# Patient Record
Sex: Male | Born: 1985 | Race: White | Hispanic: No | Marital: Single | Smoking: Former smoker
Health system: Southern US, Community
[De-identification: ages and names within clinical notes are randomized; demographics above are authoritative.]

## PROBLEM LIST (undated history)

## (undated) HISTORY — PX: WISDOM TOOTH EXTRACTION: SHX21

---
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
## 2008-05-12 ENCOUNTER — Emergency Department (HOSPITAL_COMMUNITY): Admission: EM | Admit: 2008-05-12 | Discharge: 2008-05-12 | Payer: Self-pay | Admitting: Emergency Medicine

## 2012-06-23 ENCOUNTER — Encounter (HOSPITAL_COMMUNITY): Payer: Self-pay | Admitting: Emergency Medicine

## 2012-06-23 ENCOUNTER — Emergency Department (HOSPITAL_COMMUNITY): Payer: Self-pay

## 2012-06-23 ENCOUNTER — Emergency Department (HOSPITAL_COMMUNITY)
Admission: EM | Admit: 2012-06-23 | Discharge: 2012-06-23 | Disposition: A | Discharge status: Home / Self Care | Payer: Self-pay | Attending: Emergency Medicine | Admitting: Emergency Medicine

## 2012-06-23 DIAGNOSIS — M25512 Pain in left shoulder: Secondary | ICD-10-CM

## 2012-06-23 DIAGNOSIS — X500XXA Overexertion from strenuous movement or load, initial encounter: Secondary | ICD-10-CM | POA: Insufficient documentation

## 2012-06-23 DIAGNOSIS — Y9389 Activity, other specified: Secondary | ICD-10-CM | POA: Insufficient documentation

## 2012-06-23 DIAGNOSIS — S4980XA Other specified injuries of shoulder and upper arm, unspecified arm, initial encounter: Secondary | ICD-10-CM | POA: Insufficient documentation

## 2012-06-23 DIAGNOSIS — Y929 Unspecified place or not applicable: Secondary | ICD-10-CM | POA: Insufficient documentation

## 2012-06-23 DIAGNOSIS — Z87891 Personal history of nicotine dependence: Secondary | ICD-10-CM | POA: Insufficient documentation

## 2012-06-23 DIAGNOSIS — S46909A Unspecified injury of unspecified muscle, fascia and tendon at shoulder and upper arm level, unspecified arm, initial encounter: Secondary | ICD-10-CM | POA: Insufficient documentation

## 2012-06-23 MED ORDER — NAPROXEN 500 MG PO TABS: 500.0000 mg | ORAL_TABLET | Freq: Two times a day (BID) | ORAL | Status: DC

## 2012-06-23 NOTE — Progress Notes (Signed)
[facility] did see patient and provided him with a list of primary care resources.

## 2012-06-23 NOTE — ED Provider Notes (Signed)
History     CSN: [id number]  Arrival date & time 06/23/12  1044   First MD Initiated Contact with Patient 06/23/12 1204      Chief Complaint  Patient presents with  . Shoulder Pain    (Consider location/radiation/quality/duration/timing/severity/associated sxs/prior treatment) HPI Comments: Patient reports injury to left shoulder two years ago while throwing tire up to the second floor.  Reports last week he was dumping out a bucket and develop sharp pain in his anterior shoulder in the same place he had it two years ago.  Denies neck pain, weakness or numbness of his arm. Denies other injury.   Is using heat with great improvement.    Patient is a 27 y.o. male presenting with shoulder pain. The history is provided by the patient.  Shoulder Pain Pertinent negatives include no neck pain, numbness or weakness.    History reviewed. No pertinent past medical history.  History reviewed. No pertinent past surgical history.  No family history on file.  History  Substance Use Topics  . Smoking status: Former Smoker    Quit date: 04/23/2012  . Smokeless tobacco: Not on file  . Alcohol Use: No      Review of Systems  HENT: Negative for neck pain.   Musculoskeletal: Negative for back pain.  Skin: Negative for color change and wound.  Neurological: Negative for weakness and numbness.    Allergies  Review of patient's allergies indicates no known allergies.  Home Medications   Current Outpatient Rx  Name  Route  Sig  Dispense  Refill  . Menthol-Methyl Salicylate (MUSCLE RUB) 10-15 % CREA   Topical   Apply 1 application topically as needed (shoulder pain).         . naproxen (NAPROSYN) 500 MG tablet   Oral   Take 1 tablet (500 mg total) by mouth 2 (two) times daily.   20 tablet   0     There were no vitals taken for this visit.  Physical Exam  Nursing note and vitals reviewed. Constitutional: He appears well-developed and well-nourished. No distress.  HENT:    Head: Normocephalic and atraumatic.  Neck: Neck supple.  Pulmonary/Chest: Effort normal.  Musculoskeletal:       Left shoulder: He exhibits tenderness and pain. He exhibits normal range of motion, no bony tenderness, no swelling, no effusion, no crepitus, no deformity, no laceration, no spasm, normal pulse and normal strength.       Arms: Neurological: He is alert.  Skin: He is not diaphoretic.    ED Course  Procedures (including critical care time)  Labs Reviewed - No data to display Dg Shoulder Left  06/23/2012   *RADIOLOGY REPORT*  Clinical Data: Left shoulder pain  LEFT SHOULDER - 2+ VIEW  Comparison: None.  Findings: No acute fracture or dislocation is identified.  No soft tissue abnormality is seen.  The underlying bony thorax is within normal limits.  IMPRESSION: No acute abnormality seen.   Original Report Authenticated By: Alcide Clever, M.D.    1. Left shoulder pain     MDM  Pt with chronic left shoulder pain with exacerbation last week, improving with time.  Anterior tenderness.  Pain resulted from external rotation of shoulder.  Xray is negative.  No bony tenderness.  Pt has full AROM.  Neurovascularly intact.  ACE wrap, RICE treatment, and ibuprofen for pain.  Discussed all results with patient.  Pt given return precautions.  Pt verbalizes understanding and agrees with plan.  Trixie Dredge, PA-C 06/23/12 1442

## 2012-06-23 NOTE — ED Notes (Signed)
Pt states that he moved a bucket last week and experienced some shoulder pain in lt shoulder.  States that it still hurts.

## 2012-06-23 NOTE — ED Notes (Signed)
Pt states pulled shoulder last week and has pain that has not resolved, pt states this has happened before

## 2012-06-24 NOTE — ED Provider Notes (Signed)
Medical screening examination/treatment/procedure(s) were performed by non-physician practitioner and as supervising physician I was immediately available for consultation/collaboration.  Christopher J. Pollina, MD 06/24/12 0711 

## 2014-12-29 ENCOUNTER — Encounter (HOSPITAL_COMMUNITY): Payer: Self-pay | Admitting: *Deleted

## 2014-12-29 ENCOUNTER — Emergency Department (HOSPITAL_COMMUNITY)
Admission: EM | Admit: 2014-12-29 | Discharge: 2014-12-30 | Disposition: A | Discharge status: Home / Self Care | Payer: Self-pay | Attending: Emergency Medicine | Admitting: Emergency Medicine

## 2014-12-29 DIAGNOSIS — X58XXXA Exposure to other specified factors, initial encounter: Secondary | ICD-10-CM | POA: Insufficient documentation

## 2014-12-29 DIAGNOSIS — Y990 Civilian activity done for income or pay: Secondary | ICD-10-CM | POA: Insufficient documentation

## 2014-12-29 DIAGNOSIS — Y9289 Other specified places as the place of occurrence of the external cause: Secondary | ICD-10-CM | POA: Insufficient documentation

## 2014-12-29 DIAGNOSIS — Y9389 Activity, other specified: Secondary | ICD-10-CM | POA: Insufficient documentation

## 2014-12-29 DIAGNOSIS — Z87891 Personal history of nicotine dependence: Secondary | ICD-10-CM | POA: Insufficient documentation

## 2014-12-29 DIAGNOSIS — S63502A Unspecified sprain of left wrist, initial encounter: Secondary | ICD-10-CM | POA: Insufficient documentation

## 2014-12-29 NOTE — ED Notes (Signed)
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

## 2014-12-29 NOTE — ED Provider Notes (Signed)
CSN: [id number]     Arrival date & time 12/29/14  2236 History  By signing my name below, I, [facility], attest that this documentation has been prepared under the direction and in the presence of Junius Creamer, NP. Electronically Signed: Virgel Bouquet, ED Scribe. 12/29/2014. 12:03 AM.    Chief Complaint  Patient presents with  . Hand Pain   The history is provided by the patient. No language interpreter was used.   HPI Comments: Herbert Walls is a 29 y.o. male who presents to the Emergency Department complaining of left hand pain that began 1 week ago. Patient reports that the pain began as cramping in the thumb that then progressed to sharp, shooting pain in the wrist, tonight. He states that the pain is worse with use. 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 Patient denies recent injuries.   History reviewed. No pertinent past medical history. History reviewed. No pertinent past surgical history. No family history on file. Social History  Substance Use Topics  . Smoking status: Former Smoker    Quit date: 04/23/2012  . Smokeless tobacco: None  . Alcohol Use: No    Review of Systems  Musculoskeletal: Positive for arthralgias. Negative for joint swelling.  Neurological: Negative for numbness.      Allergies  Review of patient's allergies indicates no known allergies.  Home Medications   Prior to Admission medications   Medication Sig Start Date End Date Taking? Authorizing Provider  Menthol-Methyl Salicylate (MUSCLE RUB) 10-15 % CREA Apply 1 application topically as needed (shoulder pain).    Historical Provider, MD  naproxen (NAPROSYN) 500 MG tablet Take 1 tablet (500 mg total) by mouth 2 (two) times daily. 12/30/14   Junius Creamer, NP   BP 122/67 mmHg  Pulse 98  Temp(Src) 97.9 F (36.6 C) (Oral)  Resp 16  Ht 5\' 6"  (1.676 m)  Wt 68.04 kg  BMI 24.22 kg/m2  SpO2 99% Physical Exam  Constitutional: He  appears well-developed and well-nourished.  Neck: Normal range of motion.  Cardiovascular: Normal rate.   Pulmonary/Chest: Effort normal.  Musculoskeletal: He exhibits tenderness. He exhibits no edema.       Left wrist: He exhibits decreased range of motion and tenderness. He exhibits no crepitus, no deformity and no laceration.       Arms: Neurological: He is alert.  Skin: Skin is warm and dry. No erythema.  Nursing note and vitals reviewed.   ED Course  Procedures   DIAGNOSTIC STUDIES: Oxygen Saturation is 100% on RA, normal by my interpretation.    COORDINATION OF CARE: 11:54 PM Will order left hand x-ray. Discussed treatment plan with pt at bedside and pt agreed to plan.   Labs Review Labs Reviewed - No data to display  Imaging Review Dg Wrist Complete Left  12/30/2014  CLINICAL DATA:  29 year old male with pain in the left wrist. EXAM: LEFT WRIST - COMPLETE 3+ VIEW COMPARISON:  None. FINDINGS: There is no evidence of fracture or dislocation. There is no evidence of arthropathy or other focal bone abnormality. Soft tissues are unremarkable. IMPRESSION: Negative. Electronically Signed   By: Anner Crete M.D.   On: 12/30/2014 00:28   I have personally reviewed and evaluated these images and lab results as part of my medical decision-making.   EKG Interpretation None     Patient's x-ray has been refused.  There is no abnormal pathology to explain patient's symptoms.  He has been  placed in a splint, which he states has relieved a lot of his discomfort he's been instructed to wear this for the next 7-10 days to take Naprosyn on a regular basis as well as no improvement.  He is to see Dr. Apolonio Schneiders.  The hand surgery for further evaluation MDM   Final diagnoses:  Wrist sprain, left, initial encounter       I personally performed the services described in this documentation, which was scribed in my presence. The recorded information has been reviewed and is  accurate.   Junius Creamer, NP 12/30/14 0122  Junius Creamer, NP [phone]  Delora Fuel, MD [id number] [phone]

## 2014-12-29 NOTE — ED Notes (Signed)
Pt denies trauma/injury

## 2014-12-30 ENCOUNTER — Emergency Department (HOSPITAL_COMMUNITY): Payer: Self-pay

## 2014-12-30 MED ORDER — NAPROXEN 500 MG PO TABS: 500.0000 mg | ORAL_TABLET | Freq: Two times a day (BID) | ORAL | Status: DC

## 2014-12-30 MED ORDER — ONDANSETRON 4 MG PO TBDP
4.0000 mg | ORAL_TABLET | Freq: Once | ORAL | Status: DC
Start: 2014-12-30 — End: 2014-12-30

## 2014-12-30 NOTE — ED Notes (Signed)
Patient transported to X-ray 

## 2014-12-30 NOTE — Discharge Instructions (Signed)
Your x-ray does not reveal any subluxation or deformity of any of the bones U been placed in a splint for comfort.  Please wear this at all times while you were up and about.  He may take it off to sleep and Nubain.  I would wear this on a regular basis for the next 7-10 days.  Please also take the anti-inflammatory medication on a regular basis for at least the next week.  See improvement during this.  Of time U been given a referral to Dr. Apolonio Schneiders who is a hand specialist.  Please call and make an appointment with him

## 2017-07-17 ENCOUNTER — Emergency Department (HOSPITAL_COMMUNITY): Payer: Self-pay

## 2017-07-17 ENCOUNTER — Encounter (HOSPITAL_COMMUNITY): Payer: Self-pay | Admitting: Emergency Medicine

## 2017-07-17 ENCOUNTER — Emergency Department (HOSPITAL_COMMUNITY)
Admission: EM | Admit: 2017-07-17 | Discharge: 2017-07-17 | Disposition: A | Discharge status: Home / Self Care | Payer: Self-pay | Attending: Emergency Medicine | Admitting: Emergency Medicine

## 2017-07-17 DIAGNOSIS — R109 Unspecified abdominal pain: Secondary | ICD-10-CM | POA: Insufficient documentation

## 2017-07-17 DIAGNOSIS — F17210 Nicotine dependence, cigarettes, uncomplicated: Secondary | ICD-10-CM | POA: Insufficient documentation

## 2017-07-17 DIAGNOSIS — K921 Melena: Secondary | ICD-10-CM | POA: Insufficient documentation

## 2017-07-17 LAB — CBC
HEMATOCRIT: 40.5 % (ref 39.0–52.0)
HEMOGLOBIN: 13.2 g/dL (ref 13.0–17.0)
MCH: 27 pg (ref 26.0–34.0)
MCHC: 32.6 g/dL (ref 30.0–36.0)
MCV: 83 fL (ref 78.0–100.0)
Platelets: 398 10*3/uL (ref 150–400)
RBC: 4.88 MIL/uL (ref 4.22–5.81)
RDW: 14.1 % (ref 11.5–15.5)
WBC: 7.9 10*3/uL (ref 4.0–10.5)

## 2017-07-17 LAB — COMPREHENSIVE METABOLIC PANEL
ALBUMIN: 4.5 g/dL (ref 3.5–5.0)
ALK PHOS: 56 U/L (ref 38–126)
ALT: 28 U/L (ref 0–44)
AST: 20 U/L (ref 15–41)
Anion gap: 7 (ref 5–15)
BUN: 16 mg/dL (ref 6–20)
CO2: 29 mmol/L (ref 22–32)
Calcium: 9.4 mg/dL (ref 8.9–10.3)
Chloride: 106 mmol/L (ref 98–111)
Creatinine, Ser: 0.92 mg/dL (ref 0.61–1.24)
GFR calc Af Amer: >60 mL/min (ref >60)
GFR calc non Af Amer: >60 mL/min (ref >60)
GLUCOSE: 102 mg/dL — AB (ref 70–99)
POTASSIUM: 4 mmol/L (ref 3.5–5.1)
SODIUM: 142 mmol/L (ref 135–145)
Total Bilirubin: 0.6 mg/dL (ref 0.3–1.2)
Total Protein: 8.3 g/dL — ABNORMAL HIGH (ref 6.5–8.1)

## 2017-07-17 LAB — DIFFERENTIAL
Basophils Absolute: 0.1 10*3/uL (ref 0.0–0.1)
Basophils Relative: 1 %
EOS PCT: 1 %
Eosinophils Absolute: 0.1 10*3/uL (ref 0.0–0.7)
LYMPHS ABS: 2.5 10*3/uL (ref 0.7–4.0)
LYMPHS PCT: 32 %
MONO ABS: 0.6 10*3/uL (ref 0.1–1.0)
Monocytes Relative: 7 %
Neutro Abs: 4.7 10*3/uL (ref 1.7–7.7)
Neutrophils Relative %: 59 %

## 2017-07-17 LAB — I-STAT CHEM 8, ED
BUN: 15 mg/dL (ref 6–20)
CHLORIDE: 103 mmol/L (ref 98–111)
CREATININE: 0.9 mg/dL (ref 0.61–1.24)
Calcium, Ion: 1.24 mmol/L (ref 1.15–1.40)
GLUCOSE: 96 mg/dL (ref 70–99)
HCT: 41 % (ref 39.0–52.0)
Hemoglobin: 13.9 g/dL (ref 13.0–17.0)
POTASSIUM: 3.8 mmol/L (ref 3.5–5.1)
Sodium: 142 mmol/L (ref 135–145)
TCO2: 26 mmol/L (ref 22–32)

## 2017-07-17 LAB — TYPE AND SCREEN
ABO/RH(D): A POS
Antibody Screen: NEGATIVE

## 2017-07-17 LAB — ABO/RH: ABO/RH(D): A POS

## 2017-07-17 MED ORDER — CIPROFLOXACIN HCL 500 MG PO TABS: 500.0000 mg | ORAL_TABLET | Freq: Two times a day (BID) | ORAL | 0 refills | Status: DC

## 2017-07-17 MED ORDER — IOPAMIDOL (ISOVUE-300) INJECTION 61%
100.0000 mL | Freq: Once | INTRAVENOUS | Status: AC | PRN
  Administered 2017-07-17: 100 mL via INTRAVENOUS

## 2017-07-17 MED ORDER — SODIUM CHLORIDE 0.9 % IV BOLUS
1000.0000 mL | Freq: Once | INTRAVENOUS | Status: AC
  Administered 2017-07-17: 1000 mL via INTRAVENOUS

## 2017-07-17 MED ORDER — IOPAMIDOL (ISOVUE-300) INJECTION 61%
INTRAVENOUS | Status: AC
  Filled 2017-07-17: qty 100

## 2017-07-17 NOTE — Discharge Instructions (Addendum)
Follow-up with [facility] in 1 to 2 weeks for recheck

## 2017-07-17 NOTE — ED Triage Notes (Signed)
Pt reports that he was at work and started having abd cramping and had 5 differ trips to restroom. Denies diarrhea but states that last 2 BMs had blood in it.

## 2017-07-17 NOTE — ED Provider Notes (Signed)
[facility] DEPT Provider Note   CSN: [id number] Arrival date & time: 07/17/17  1452     History   Chief Complaint Chief Complaint  Patient presents with  . Blood In Stools    HPI Herbert Walls is a 32 y.o. male.  Patient complains of blood in stool with abdominal cramping and diarrhea.  The history is provided by the patient. No language interpreter was used.  Rectal Bleeding  Quality:  Bright red Amount:  Moderate Timing:  Constant Chronicity:  New Context: not anal fissures   Similar prior episodes: no   Relieved by:  Nothing Worsened by:  Nothing Ineffective treatments:  None tried Associated symptoms: no abdominal pain     History reviewed. No pertinent past medical history.  There are no active problems to display for this patient.   Past Surgical History:  Procedure Laterality Date  . WISDOM TOOTH EXTRACTION          Home Medications    Prior to Admission medications   Medication Sig Start Date End Date Taking? Authorizing Provider  ciprofloxacin (CIPRO) 500 MG tablet Take 1 tablet (500 mg total) by mouth 2 (two) times daily. 07/17/17   Milton Ferguson, MD  naproxen (NAPROSYN) 500 MG tablet Take 1 tablet (500 mg total) by mouth 2 (two) times daily. Patient not taking: Reported on 07/17/2017 12/30/14   Junius Creamer, NP    Family History No family history on file.  Social History Social History   Tobacco Use  . Smoking status: Current Every Day Smoker    Types: Cigarettes  . Smokeless tobacco: Never Used  Substance Use Topics  . Alcohol use: No  . Drug use: No     Allergies   Patient has no known allergies.   Review of Systems Review of Systems  Constitutional: Negative for appetite change and fatigue.  HENT: Negative for congestion, ear discharge and sinus pressure.   Eyes: Negative for discharge.  Respiratory: Negative for cough.   Cardiovascular: Negative for chest pain.  Gastrointestinal: Positive  for diarrhea and hematochezia. Negative for abdominal pain.  Genitourinary: Negative for frequency and hematuria.  Musculoskeletal: Negative for back pain.  Skin: Negative for rash.  Neurological: Negative for seizures and headaches.  Psychiatric/Behavioral: Negative for hallucinations.     Physical Exam Updated Vital Signs BP (!) 142/76 (BP Location: Left Arm)   Pulse (!) 105   Temp 98.5 F (36.9 C) (Oral)   Resp 16   Ht 5\' 6"  (1.676 m)   Wt 70.3 kg (155 lb)   SpO2 100%   BMI 25.02 kg/m   Physical Exam  Constitutional: He is oriented to person, place, and time. He appears well-developed.  HENT:  Head: Normocephalic.  Eyes: Conjunctivae and EOM are normal. No scleral icterus.  Neck: Neck supple. No thyromegaly present.  Cardiovascular: Normal rate and regular rhythm. Exam reveals no gallop and no friction rub.  No murmur heard. Pulmonary/Chest: No stridor. He has no wheezes. He has no rales. He exhibits no tenderness.  Abdominal: He exhibits no distension. There is tenderness. There is no rebound.  Musculoskeletal: Normal range of motion. He exhibits no edema.  Lymphadenopathy:    He has no cervical adenopathy.  Neurological: He is oriented to person, place, and time. He exhibits normal muscle tone. Coordination normal.  Skin: No rash noted. No erythema.  Psychiatric: He has a normal mood and affect. His behavior is normal.     ED Treatments / Results  Labs (  all labs ordered are listed, but only abnormal results are displayed) Labs Reviewed  COMPREHENSIVE METABOLIC PANEL - Abnormal; Notable for the following components:      Result Value   Glucose, Bld 102 (*)    Total Protein 8.3 (*)    All other components within normal limits  CBC  DIFFERENTIAL  I-STAT CHEM 8, ED  POC OCCULT BLOOD, ED  TYPE AND SCREEN    EKG None  Radiology Ct Abdomen Pelvis W Contrast  Result Date: 07/17/2017 CLINICAL DATA:  Abdominal cramping.  Reported blood in the stool. EXAM: CT  ABDOMEN AND PELVIS WITH CONTRAST TECHNIQUE: Multidetector CT imaging of the abdomen and pelvis was performed using the standard protocol following bolus administration of intravenous contrast. CONTRAST:  158mL ISOVUE-300 IOPAMIDOL (ISOVUE-300) INJECTION 61% COMPARISON:  None. FINDINGS: Lower chest: Clear lung bases.  Heart normal in size Hepatobiliary: No focal liver abnormality is seen. No gallstones, gallbladder wall thickening, or biliary dilatation. Pancreas: Unremarkable. No pancreatic ductal dilatation or surrounding inflammatory changes. Spleen: Normal in size without focal abnormality. Adrenals/Urinary Tract: Adrenal glands are unremarkable. Kidneys are normal, without renal calculi, focal lesion, or hydronephrosis. Bladder is unremarkable. Stomach/Bowel: Stomach is within normal limits. Appendix appears normal. No evidence of bowel wall thickening, distention, or inflammatory changes. No increase in colonic stool. There are few small left colon diverticula. Vascular/Lymphatic: No significant vascular findings are present. No enlarged abdominal or pelvic lymph nodes. Reproductive: Unremarkable. Other: No abdominal wall hernia or abnormality. No abdominopelvic ascites. Musculoskeletal: No acute or significant osseous findings. IMPRESSION: 1. No acute findings within the abdomen or pelvis. 2. There are few small left colon diverticula. No diverticulitis. No other bowel abnormality. Exam otherwise within normal limits. Electronically Signed   By: Lajean Manes M.D.   On: 07/17/2017 17:32    Procedures Procedures (including critical care time)  Medications Ordered in ED Medications  iopamidol (ISOVUE-300) 61 % injection (has no administration in time range)  sodium chloride 0.9 % bolus 1,000 mL (1,000 mLs Intravenous New Bag/Given 07/17/17 1635)  iopamidol (ISOVUE-300) 61 % injection 100 mL (100 mLs Intravenous Contrast Given 07/17/17 1708)     Initial Impression / Assessment and Plan / ED Course  I  have reviewed the triage vital signs and the nursing notes.  Pertinent labs & imaging results that were available during my care of the patient were reviewed by me and considered in my medical decision making (see chart for details).     Labs and CT unremarkable.  Suspect mild colitis.  He will be placed on Cipro and will follow-up with GI  Final Clinical Impressions(s) / ED Diagnoses   Final diagnoses:  None    ED Discharge Orders        Ordered    ciprofloxacin (CIPRO) 500 MG tablet  2 times daily     07/17/17 1746       Milton Ferguson, MD 07/17/17 1750

## 2019-04-07 IMAGING — CT CT ABD-PELV W/ CM
2 of 4 series · 17 of 46 positions shown, 19 images · IV contrast (ISOVUE)
Comparison: None.

CLINICAL DATA: Abdominal cramping.  Reported blood in the stool.

EXAM:
CT ABDOMEN AND PELVIS WITH CONTRAST
TECHNIQUE: Multidetector CT imaging of the abdomen and pelvis was performed
using the standard protocol following bolus administration of
intravenous contrast.
CONTRAST:  100mL D25US0-K33 IOPAMIDOL (D25US0-K33) INJECTION 61%

[Series 2: axial st · axial · 0.80mm/px · z∈[-458,-63]mm · 14 of 89 slices shown, 16 images]
[im 5/89  soft-tissue]
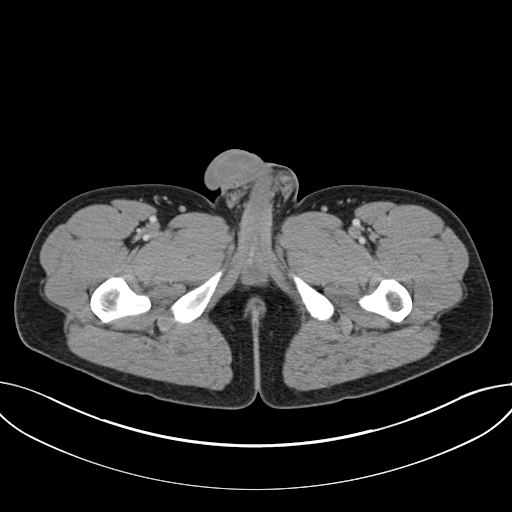
[im 5/89  bone]
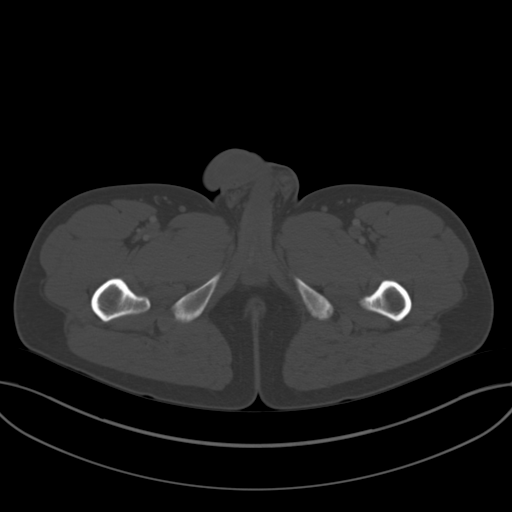
[im 10/89  soft-tissue]
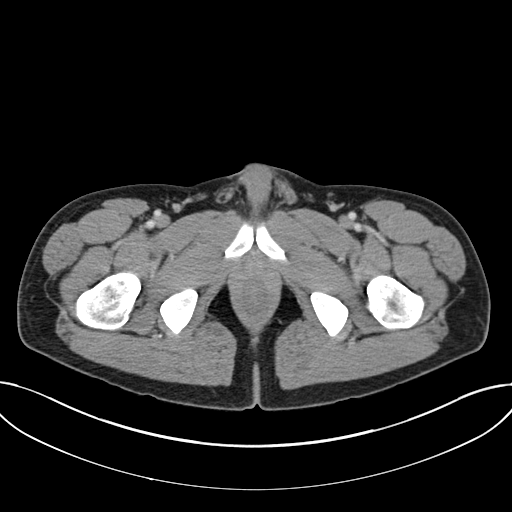
[im 20/89  soft-tissue]
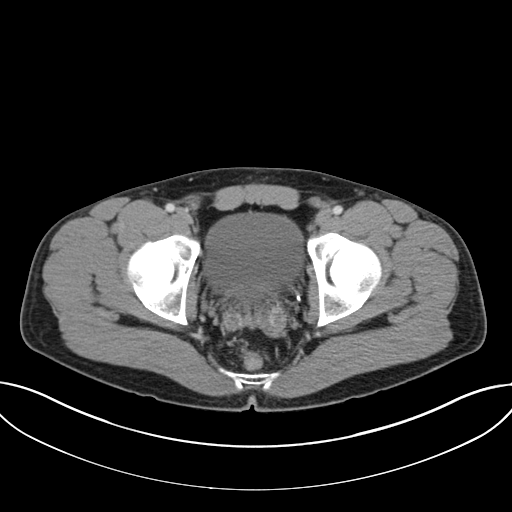
[im 25/89  soft-tissue]
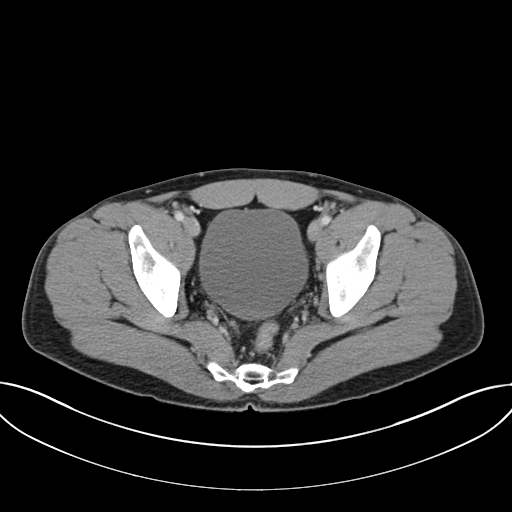
[im 30/89  soft-tissue]
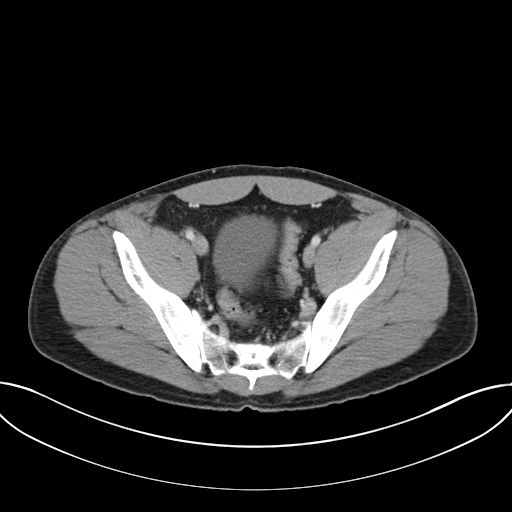
[im 35/89  soft-tissue]
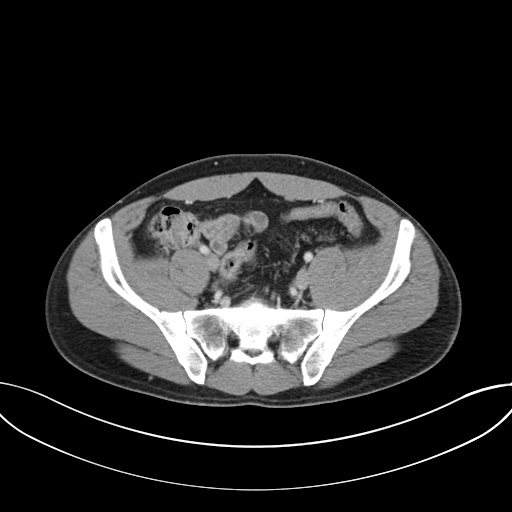
[im 40/89  soft-tissue]
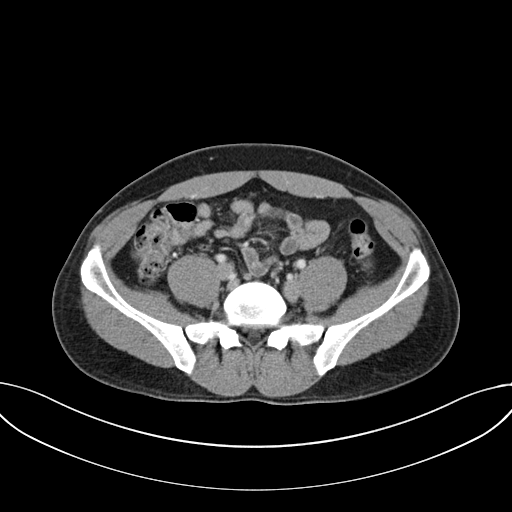
[im 49/89  soft-tissue]
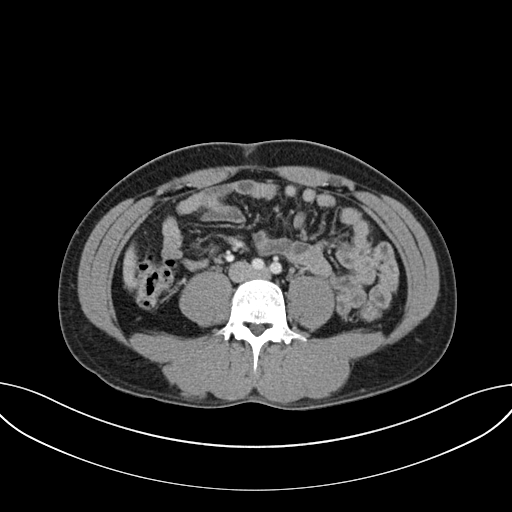
[im 54/89  soft-tissue]
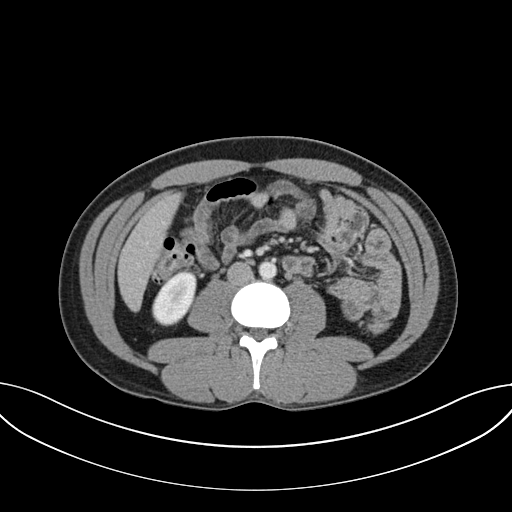
[im 54/89  bone]
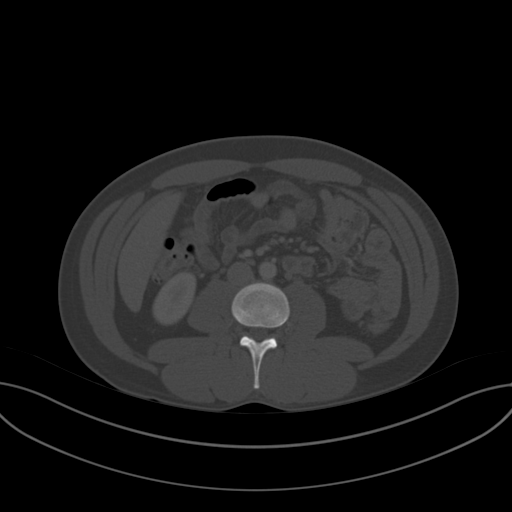
[im 59/89  soft-tissue]
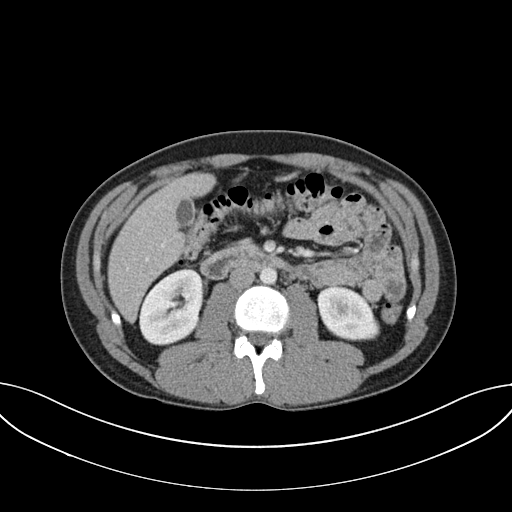
[im 64/89  soft-tissue]
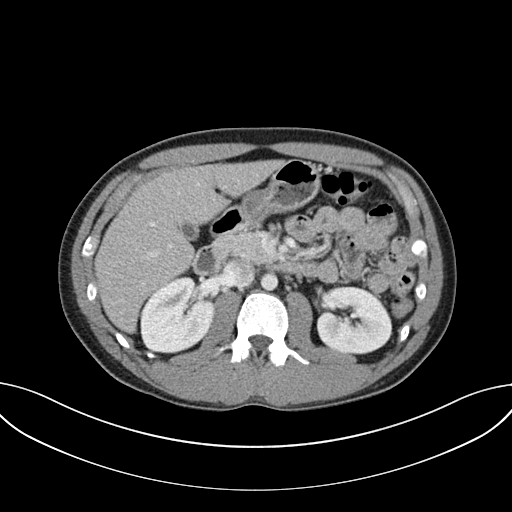
[im 69/89  soft-tissue]
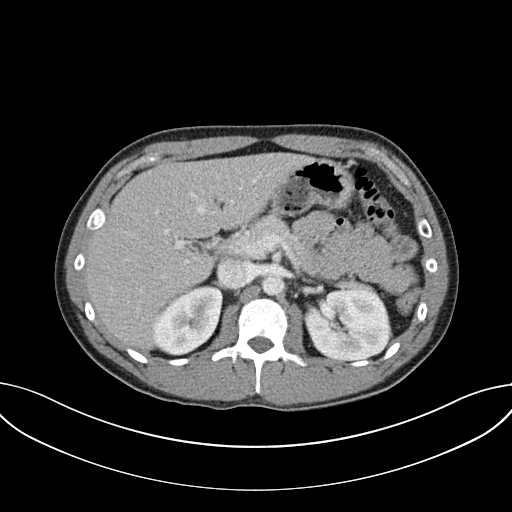
[im 79/89  soft-tissue]
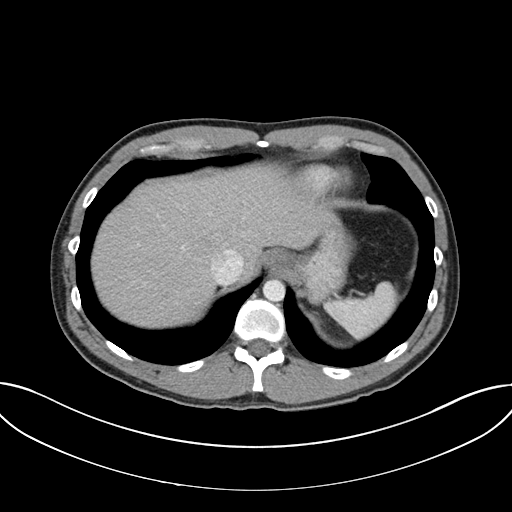
[im 84/89  soft-tissue]
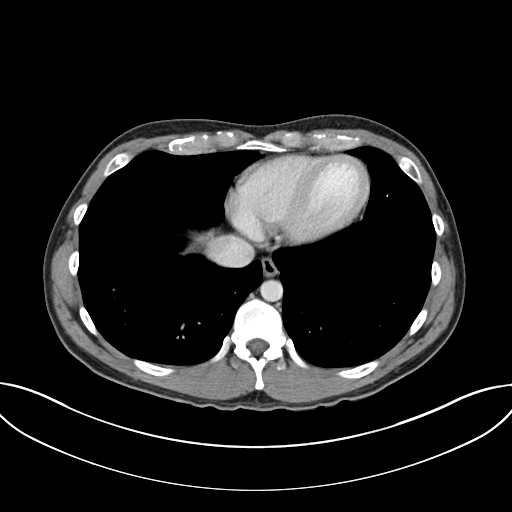

[Series 5: coronal st · coronal · 0.84mm/px · 3 of 101 slices shown]
[im 34/101  soft-tissue]
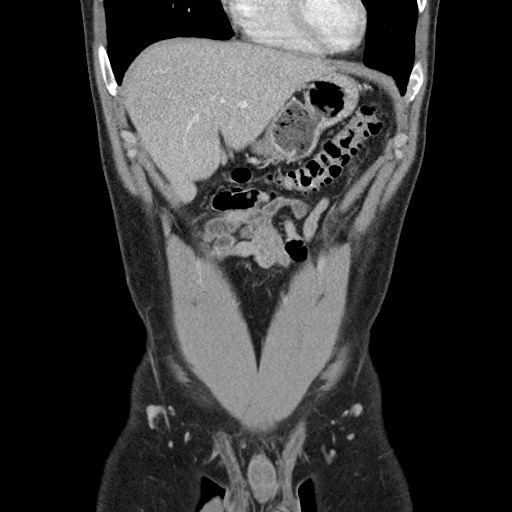
[im 45/101  soft-tissue]
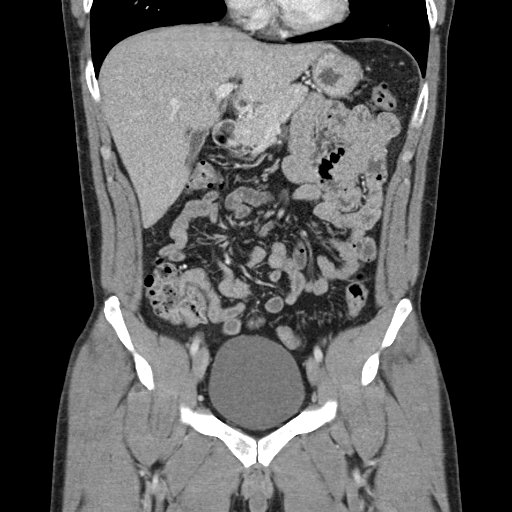
[im 56/101  soft-tissue]
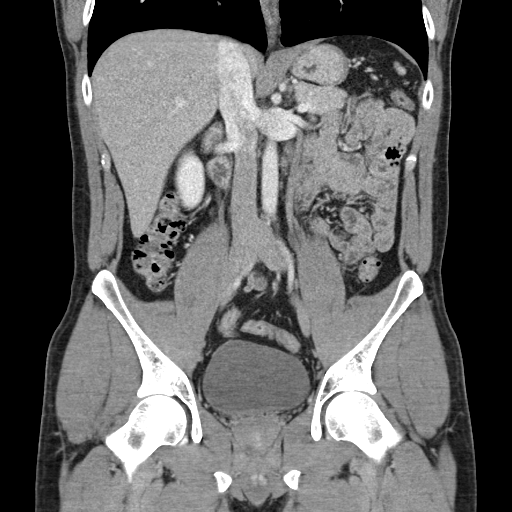

[17 of 46 positions shown; findings below may reference images not displayed]

FINDINGS: Lower chest: Clear lung bases.  Heart normal in size

Hepatobiliary: No focal liver abnormality is seen. No gallstones,
gallbladder wall thickening, or biliary dilatation.

Pancreas: Unremarkable. No pancreatic ductal dilatation or
surrounding inflammatory changes.

Spleen: Normal in size without focal abnormality.

Adrenals/Urinary Tract: Adrenal glands are unremarkable. Kidneys are
normal, without renal calculi, focal lesion, or hydronephrosis.
Bladder is unremarkable.

Stomach/Bowel: Stomach is within normal limits. Appendix appears
normal. No evidence of bowel wall thickening, distention, or
inflammatory changes. No increase in colonic stool. There are few
small left colon diverticula.

Vascular/Lymphatic: No significant vascular findings are present. No
enlarged abdominal or pelvic lymph nodes.

Reproductive: Unremarkable.

Other: No abdominal wall hernia or abnormality. No abdominopelvic
ascites.

Musculoskeletal: No acute or significant osseous findings.
IMPRESSION: 1. No acute findings within the abdomen or pelvis.
2. There are few small left colon diverticula. No diverticulitis. No
other bowel abnormality. Exam otherwise within normal limits.

## 2020-11-15 ENCOUNTER — Other Ambulatory Visit: Payer: Self-pay

## 2020-11-15 ENCOUNTER — Ambulatory Visit (HOSPITAL_COMMUNITY)
Admission: EM | Admit: 2020-11-15 | Discharge: 2020-11-15 | Disposition: A | Discharge status: Home / Self Care | Payer: Self-pay | Attending: Physician Assistant | Admitting: Physician Assistant

## 2020-11-15 ENCOUNTER — Encounter (HOSPITAL_COMMUNITY): Payer: Self-pay

## 2020-11-15 DIAGNOSIS — R509 Fever, unspecified: Secondary | ICD-10-CM | POA: Insufficient documentation

## 2020-11-15 DIAGNOSIS — J069 Acute upper respiratory infection, unspecified: Secondary | ICD-10-CM | POA: Insufficient documentation

## 2020-11-15 DIAGNOSIS — U071 COVID-19: Secondary | ICD-10-CM | POA: Insufficient documentation

## 2020-11-15 DIAGNOSIS — J029 Acute pharyngitis, unspecified: Secondary | ICD-10-CM | POA: Insufficient documentation

## 2020-11-15 LAB — POCT RAPID STREP A, ED / UC: Streptococcus, Group A Screen (Direct): NEGATIVE

## 2020-11-15 LAB — POCT INFECTIOUS MONO SCREEN, ED / UC: Mono Screen: NEGATIVE

## 2020-11-15 LAB — SARS CORONAVIRUS 2 (TAT 6-24 HRS): SARS Coronavirus 2: POSITIVE — AB

## 2020-11-15 MED ORDER — PREDNISONE 20 MG PO TABS: 40.0000 mg | ORAL_TABLET | Freq: Every day | ORAL | 0 refills | Status: AC

## 2020-11-15 NOTE — ED Provider Notes (Signed)
[place]    CSN: [id number] Arrival date & time: 11/15/20  0930      History   Chief Complaint Chief Complaint  Patient presents with   Sore Throat   Generalized Body Aches    HPI Herbert Walls is a 35 y.o. male.   Patient presents today with a 4-day history of sore throat and fever.  Reports associated fatigue, malaise, body aches.  Denies any cough, congestion, chest pain, shortness of breath, nausea, vomiting.  Does report sick contacts but is unsure if they were ultimately diagnosed with.  Denies any recent antibiotic use.  Has tried NyQuil and DayQuil with improvement but not resolution of symptoms.  He is able to eat and drink despite sore throat but does report difficulty with solid food as result of symptoms.  Pain is rated 7 on a 0-10 pain scale, localized to posterior oropharynx, described as sharp, worse with swallowing, no alleviating factors identified.  Denies any muffled voice, difficulty swallowing secretions, shortness of breath.   History reviewed. No pertinent past medical history.  There are no problems to display for this patient.   Past Surgical History:  Procedure Laterality Date   WISDOM TOOTH EXTRACTION         Home Medications    Prior to Admission medications   Medication Sig Start Date End Date Taking? Authorizing Provider  predniSONE (DELTASONE) 20 MG tablet Take 2 tablets (40 mg total) by mouth daily for 4 days. 11/15/20 11/19/20 Yes Meagan Spease, Derry Skill, PA-C    Family History Family History  Family history unknown: Yes    Social History Social History   Tobacco Use   Smoking status: Every Day    Types: Cigarettes   Smokeless tobacco: Never  Vaping Use   Vaping Use: Former  Substance Use Topics   Alcohol use: No   Drug use: No     Allergies   Patient has no known allergies.   Review of Systems Review of Systems  Constitutional:  Positive for activity change, fatigue and fever. Negative for appetite change.   HENT:  Positive for sore throat and trouble swallowing. Negative for congestion, sinus pressure and sneezing.   Respiratory:  Negative for cough and shortness of breath.   Cardiovascular:  Negative for chest pain.  Gastrointestinal:  Negative for abdominal pain, diarrhea, nausea and vomiting.  Musculoskeletal:  Positive for arthralgias and myalgias.  Neurological:  Negative for dizziness, light-headedness and headaches.    Physical Exam Triage Vital Signs ED Triage Vitals  Enc Vitals Group     BP 11/15/20 1109 125/83     Pulse Rate 11/15/20 1109 99     Resp 11/15/20 1109 18     Temp 11/15/20 1109 (!) 100.8 F (38.2 C)     Temp Source 11/15/20 1109 Oral     SpO2 11/15/20 1109 96 %     Weight --      Height --      Head Circumference --      Peak Flow --      Pain Score 11/15/20 1108 7     Pain Loc --      Pain Edu? --      Excl. in [place]? --    No data found.  Updated Vital Signs BP 125/83 (BP Location: Right Arm)   Pulse 99   Temp (!) 100.8 F (38.2 C) (Oral)   Resp 18   SpO2 96%   Visual Acuity Right Eye Distance:  Left Eye Distance:   Bilateral Distance:    Right Eye Near:   Left Eye Near:    Bilateral Near:     Physical Exam Vitals reviewed.  Constitutional:      General: He is awake.     Appearance: Normal appearance. He is well-developed. He is not ill-appearing.     Comments: Very pleasant male appears stated age in no acute distress sitting comfortably in exam room  HENT:     Head: Normocephalic and atraumatic.     Right Ear: Tympanic membrane, ear canal and external ear normal. Tympanic membrane is not erythematous or bulging.     Left Ear: Tympanic membrane, ear canal and external ear normal. Tympanic membrane is not erythematous or bulging.     Nose: Nose normal.     Mouth/Throat:     Pharynx: Uvula midline. Posterior oropharyngeal erythema present. No oropharyngeal exudate.     Tonsils: No tonsillar exudate or tonsillar abscesses. 2+ on the  right. 2+ on the left.  Cardiovascular:     Rate and Rhythm: Normal rate and regular rhythm.     Heart sounds: Normal heart sounds, S1 normal and S2 normal. No murmur heard. Pulmonary:     Effort: Pulmonary effort is normal. No accessory muscle usage or respiratory distress.     Breath sounds: Normal breath sounds. No stridor. No wheezing, rhonchi or rales.     Comments: Clear to auscultation bilaterally Abdominal:     General: Bowel sounds are normal.     Palpations: Abdomen is soft.     Tenderness: There is no abdominal tenderness.  Lymphadenopathy:     Head:     Right side of head: No submental, submandibular or tonsillar adenopathy.     Left side of head: No submental, submandibular or tonsillar adenopathy.     Cervical: No cervical adenopathy.  Neurological:     Mental Status: He is alert.  Psychiatric:        Behavior: Behavior is cooperative.     UC Treatments / Results  Labs (all labs ordered are listed, but only abnormal results are displayed) Labs Reviewed  CULTURE, GROUP A STREP ([place])  SARS CORONAVIRUS 2 (TAT 6-24 HRS)  POCT RAPID STREP A, ED / UC  POCT INFECTIOUS MONO SCREEN, ED / UC    EKG   Radiology No results found.  Procedures Procedures (including critical care time)  Medications Ordered in UC Medications - No data to display  Initial Impression / Assessment and Plan / UC Course  I have reviewed the triage vital signs and the nursing notes.  Pertinent labs & imaging results that were available during my care of the patient were reviewed by me and considered in my medical decision making (see chart for details).     Strep and mono were negative in clinic today.  Strep culture is pending but will defer antibiotics until results are obtained.  Concern for viral pharyngitis versus COVID.  Will test for COVID and patient was instructed to remain in isolation until results are obtained.  He was given work excuse note with current CDC return to work  guidelines based on COVID test result.  He was prescribed prednisone 40 mg for 4 days to help manage symptoms.  Discussed that he is not to take NSAIDs with this medication due to risk of GI bleeding.  He can use Tylenol for additional symptom relief.  Recommended gargling with warm salt water.  Discussed that if symptoms or not improving and  he has severe sore throat, shortness of breath, difficulty speaking, difficulty swallowing he needs to go to the emergency room or be seen immediately.  Strict return precautions given to which he expressed understanding.  Final Clinical Impressions(s) / UC Diagnoses   Final diagnoses:  Upper respiratory tract infection, unspecified type  Sore throat  Fever, unspecified     Discharge Instructions      Your strep and mono were negative in clinic today.  If your throat culture is positive we will contact you.  We will contact you if your COVID test is positive.  Please remain at home until you receive your COVID test result and follow instructions on note regarding when you can return based on test result.  Take prednisone to help with your symptoms.  You should not take NSAIDs including aspirin, ibuprofen/Advil, naproxen/Aleve with this medication as an cause stomach bleeding.  You can use Tylenol.  Gargle with warm salt water.  If you have any high fever, difficulty swallowing, shortness of breath, difficulty speaking you need to go to the emergency room.     ED Prescriptions     Medication Sig Dispense Auth. Provider   predniSONE (DELTASONE) 20 MG tablet Take 2 tablets (40 mg total) by mouth daily for 4 days. 8 tablet Heydy Montilla, Derry Skill, PA-C      PDMP not reviewed this encounter.   Terrilee Croak, PA-C 11/15/20 1156

## 2020-11-15 NOTE — Discharge Instructions (Signed)
Your strep and mono were negative in clinic today.  If your throat culture is positive we will contact you.  We will contact you if your COVID test is positive.  Please remain at home until you receive your COVID test result and follow instructions on note regarding when you can return based on test result.  Take prednisone to help with your symptoms.  You should not take NSAIDs including aspirin, ibuprofen/Advil, naproxen/Aleve with this medication as an cause stomach bleeding.  You can use Tylenol.  Gargle with warm salt water.  If you have any high fever, difficulty swallowing, shortness of breath, difficulty speaking you need to go to the emergency room.

## 2020-11-15 NOTE — ED Triage Notes (Signed)
Pt presents with generalized body aches, sore throat and chills X 3 days.

## 2020-11-18 LAB — CULTURE, GROUP A STREP (THRC)

## 2021-01-09 ENCOUNTER — Encounter: Payer: Self-pay | Admitting: Gastroenterology

## 2021-01-25 DIAGNOSIS — K625 Hemorrhage of anus and rectum: Secondary | ICD-10-CM

## 2021-01-29 ENCOUNTER — Ambulatory Visit: Payer: Self-pay | Admitting: Gastroenterology

## 2021-01-29 ENCOUNTER — Other Ambulatory Visit (INDEPENDENT_AMBULATORY_CARE_PROVIDER_SITE_OTHER): Payer: Self-pay

## 2021-01-29 ENCOUNTER — Encounter: Payer: Self-pay | Admitting: Gastroenterology

## 2021-01-29 VITALS — BP 96/60 | HR 93 | Ht 66.0 in | Wt 169.0 lb

## 2021-01-29 DIAGNOSIS — K625 Hemorrhage of anus and rectum: Secondary | ICD-10-CM

## 2021-01-29 LAB — CBC
HCT: 40 % (ref 39.0–52.0)
Hemoglobin: 12.9 g/dL — ABNORMAL LOW (ref 13.0–17.0)
MCHC: 32.3 g/dL (ref 30.0–36.0)
MCV: 80.2 fl (ref 78.0–100.0)
Platelets: 369 10*3/uL (ref 150.0–400.0)
RBC: 4.98 Mil/uL (ref 4.22–5.81)
RDW: 14.4 % (ref 11.5–15.5)
WBC: 5.2 10*3/uL (ref 4.0–10.5)

## 2021-01-29 NOTE — Progress Notes (Signed)
HPI: This is a very pleasant 36 year old man who was referred to me by Kurtis Bushman, Ela*  to evaluate rectal bleeding.    He has had intermittent rectal bleeding for at least 10 years.  It used to occur about once a year or once every 6 months.  Just prior to [place] he had some left-sided abdominal discomforts and rectal bleeding.  He presented to emergency room with the work-up below.  He was referred to a gastroenterologist but due to [place] he canceled that.  He continues to have intermittent minor rectal bleeding.  Last month he had 4 days in a row of this, it was bright red blood on the tissue paper only.  He does feel some pressure-like sensation in his backside when he is having these bleedings.  No serious abdominal pains.  Colon cancer does not run in his family however he does not know anything about his father's side of the family.  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  Reported blood in stool."  Findings no acute findings in the abdomen or pelvis.  "There are a few small left colon diverticula."  Blood work June 2019 CBC was normal    Review of systems: Pertinent positive and negative review of systems were noted in the above HPI section. All other review negative.   History reviewed. No pertinent past medical history.  Past Surgical History:  Procedure Laterality Date   WISDOM TOOTH EXTRACTION      No current outpatient medications on file.   No current facility-administered medications for this visit.    Allergies as of 01/29/2021   (No Known Allergies)    Family History  Problem Relation Age of Onset   Healthy Mother    Healthy Father    Colon cancer Neg Hx    Esophageal cancer Neg Hx    Stomach cancer Neg Hx     Social History   Socioeconomic History   Marital status: Single    Spouse  name: Not on file   Number of children: Not on file   Years of education: Not on file   Highest education level: Not on file  Occupational History   Not on file  Tobacco Use   Smoking status: Former    Types: Cigarettes   Smokeless tobacco: Never  Vaping Use   Vaping Use: Every day  Substance and Sexual Activity   Alcohol use: No    Comment: rarely   Drug use: No   Sexual activity: Not on file  Other Topics Concern   Not on file  Social History Narrative   Not on file   Social Determinants of Health   Financial Resource Strain: Not on file  Food Insecurity: Not on file  Transportation Needs: Not on file  Physical Activity: Not on file  Stress: Not on file  Social Connections: Not on file  Intimate Partner Violence: Not on file     Physical Exam: Ht 5\' 6"  (1.676 m)    Wt 169 lb (76.7 kg)    BMI 27.28 kg/m  Constitutional: generally well-appearing Psychiatric: alert and oriented x3 Eyes: extraocular movements intact Mouth: oral pharynx moist, no lesions Neck: supple no lymphadenopathy Cardiovascular: heart regular rate and rhythm Lungs: clear to auscultation bilaterally Abdomen: soft, nontender, nondistended, no obvious ascites, no peritoneal signs, normal bowel sounds  Extremities: no lower extremity edema bilaterally Skin: no lesions on visible extremities Rectal exam deferred for upcoming colonoscopy  Assessment and plan: 36 y.o. male with minor intermittent rectal bleeding  Going to get a CBC today to make sure he has not blood not become anemic and I doubt that that is the case.  I recommended a colonoscopy at his soonest convenience to check for hemorrhoids, neoplastic process, colitis.  I think this is likely a benign problem such as hemorrhoid disease.  Please see the "Patient Instructions" section for addition details about the plan.   Owens Loffler, MD [facility] 01/29/2021, 11:14 AM  Cc: Jinny Sanders*  Total time on date of  encounter was 45 minutes (this included time spent preparing to see the patient reviewing records; obtaining and/or reviewing separately obtained history; performing a medically appropriate exam and/or evaluation; counseling and educating the patient and family if present; ordering medications, tests or procedures if applicable; and documenting clinical information in the health record).

## 2021-01-29 NOTE — Patient Instructions (Addendum)
If you are age 35 or younger, your body mass index should be between 19-25. Your Body mass index is 27.28 kg/m. If this is out of the aformentioned range listed, please consider follow up with your Primary Care Provider.  ________________________________________________________  The [facility] providers would like to encourage you to use [facility] to communicate with providers for non-urgent requests or questions.  Due to long hold times on the telephone, sending your provider a message by [facility] may be a faster and more efficient way to get a response.  Please allow 48 business hours for a response.  Please remember that this is for non-urgent requests.  _______________________________________________________  Your provider has requested that you go to the basement level for lab work before leaving today. Press "B" on the elevator. The lab is located at the first door on the left as you exit the elevator.  You have been scheduled for a colonoscopy. Please follow written instructions given to you at your visit today.  Please pick up your prep supplies at the pharmacy within the next 1-3 days. If you use inhalers (even only as needed), please bring them with you on the day of your procedure.  Due to recent changes in healthcare laws, you may see the results of your imaging and laboratory studies on MyChart before your provider has had a chance to review them.  We understand that in some cases there may be results that are confusing or concerning to you. Not all laboratory results come back in the same time frame and the provider may be waiting for multiple results in order to interpret others.  Please give [place] 48 hours in order for your provider to thoroughly review all the results before contacting the office for clarification of your results.   Thank you for entrusting me with your care and choosing [facility].  Dr Ardis Hughs

## 2021-02-02 ENCOUNTER — Encounter: Payer: Self-pay | Admitting: Gastroenterology

## 2021-02-02 ENCOUNTER — Ambulatory Visit (AMBULATORY_SURGERY_CENTER): Payer: Self-pay | Admitting: Gastroenterology

## 2021-02-02 ENCOUNTER — Other Ambulatory Visit: Payer: Self-pay

## 2021-02-02 VITALS — BP 96/60 | HR 95 | Temp 98.3°F | Resp 11 | Ht 66.0 in | Wt 169.0 lb

## 2021-02-02 DIAGNOSIS — D123 Benign neoplasm of transverse colon: Secondary | ICD-10-CM

## 2021-02-02 DIAGNOSIS — K625 Hemorrhage of anus and rectum: Secondary | ICD-10-CM

## 2021-02-02 DIAGNOSIS — K648 Other hemorrhoids: Secondary | ICD-10-CM

## 2021-02-02 MED ORDER — SODIUM CHLORIDE 0.9 % IV SOLN: 500.0000 mL | Freq: Once | INTRAVENOUS | Status: DC

## 2021-02-02 NOTE — Progress Notes (Signed)
PT taken to PACU. Monitors in place. VSS. Report given to RN. 

## 2021-02-02 NOTE — Progress Notes (Signed)
Called to room to assist during endoscopic procedure.  Patient ID and intended procedure confirmed with present staff. Received instructions for my participation in the procedure from the performing physician.  

## 2021-02-02 NOTE — Progress Notes (Signed)
°  The recent H&P (dated 01/29/2021) was reviewed, the patient was examined and there is no change in the patients condition since that H&P was completed.   Herbert Walls  02/02/2021, 2:57 PM

## 2021-02-02 NOTE — Op Note (Signed)
[place] Patient Name: Herbert Walls Procedure Date: 02/02/2021 3:03 PM MRN: [id number] Endoscopist: Milus Banister , MD Age: 36 Referring MD:  Date of Birth: March 29, 1985 Gender: Male Account #: [id number] Procedure:                Colonoscopy Indications:              Rectal bleeding Medicines:                Monitored Anesthesia Care Procedure:                Pre-Anesthesia Assessment:                           - Prior to the procedure, a History and Physical                            was performed, and patient medications and                            allergies were reviewed. The patient's tolerance of                            previous anesthesia was also reviewed. The risks                            and benefits of the procedure and the sedation                            options and risks were discussed with the patient.                            All questions were answered, and informed consent                            was obtained. Prior Anticoagulants: The patient has                            taken no previous anticoagulant or antiplatelet                            agents. ASA Grade Assessment: II - A patient with                            mild systemic disease. After reviewing the risks                            and benefits, the patient was deemed in                            satisfactory condition to undergo the procedure.                           After obtaining informed consent, the colonoscope  was passed under direct vision. Throughout the                            procedure, the patient's blood pressure, pulse, and                            oxygen saturations were monitored continuously. The                            Olympus CF-HQ190L [id number]) Colonoscope was                            introduced through the anus and advanced to the the                            cecum, identified by appendiceal orifice and                             ileocecal valve. The colonoscopy was performed                            without difficulty. The patient tolerated the                            procedure well. The quality of the bowel                            preparation was good. The ileocecal valve,                            appendiceal orifice, and rectum were photographed. Scope In: 3:11:29 PM Scope Out: 3:25:28 PM Scope Withdrawal Time: 0 hours 10 minutes 50 seconds  Total Procedure Duration: 0 hours 13 minutes 59 seconds  Findings:                 A 5 mm polyp was found in the ascending colon. The                            polyp was sessile. The polyp was removed with a                            cold snare. Resection and retrieval were complete.                           Internal hemorrhoids were found. The hemorrhoids                            were small.                           The exam was otherwise without abnormality on                            direct and retroflexion views. Complications:  No immediate complications. Estimated blood loss:                            None. Estimated Blood Loss:     Estimated blood loss: none. Impression:               - One 5 mm polyp in the ascending colon, removed                            with a cold snare. Resected and retrieved.                           - Internal hemorrhoids.                           - The examination was otherwise normal on direct                            and retroflexion views. Recommendation:           - Patient has a contact number available for                            emergencies. The signs and symptoms of potential                            delayed complications were discussed with the                            patient. Return to normal activities tomorrow.                            Written discharge instructions were provided to the                            patient.                           - Resume  previous diet.                           - Continue present medications.                           - Await pathology results. Milus Banister, MD 02/02/2021 3:27:54 PM This report has been signed electronically.

## 2021-02-02 NOTE — Progress Notes (Signed)
Pt's states no medical or surgical changes since previsit or office visit. 

## 2021-02-02 NOTE — Patient Instructions (Signed)
YOU HAD AN ENDOSCOPIC PROCEDURE TODAY AT [facility]:   Refer to the procedure report that was given to you for any specific questions about what was found during the examination.  If the procedure report does not answer your questions, please call your gastroenterologist to clarify.  If you requested that your care partner not be given the details of your procedure findings, then the procedure report has been included in a sealed envelope for you to review at your convenience later.  **Handouts given on polyps and hemorrhoids**  YOU SHOULD EXPECT: Some feelings of bloating in the abdomen. Passage of more gas than usual.  Walking can help get rid of the air that was put into your GI tract during the procedure and reduce the bloating. If you had a lower endoscopy (such as a colonoscopy or flexible sigmoidoscopy) you may notice spotting of blood in your stool or on the toilet paper. If you underwent a bowel prep for your procedure, you may not have a normal bowel movement for a few days.  Please Note:  You might notice some irritation and congestion in your nose or some drainage.  This is from the oxygen used during your procedure.  There is no need for concern and it should clear up in a day or so.  SYMPTOMS TO REPORT IMMEDIATELY:  Following lower endoscopy (colonoscopy or flexible sigmoidoscopy):  Excessive amounts of blood in the stool  Significant tenderness or worsening of abdominal pains  Swelling of the abdomen that is new, acute  Fever of 100F or higher   For urgent or emergent issues, a gastroenterologist can be reached at any hour by calling [phone]. Do not use MyChart messaging for urgent concerns.    DIET:  We do recommend a small meal at first, but then you may proceed to your regular diet.  Drink plenty of fluids but you should avoid alcoholic beverages for 24 hours.  ACTIVITY:  You should plan to take it easy for the rest of today and you should NOT DRIVE  or use heavy machinery until tomorrow (because of the sedation medicines used during the test).    FOLLOW UP: Our staff will call the number listed on your records 48-72 hours following your procedure to check on you and address any questions or concerns that you may have regarding the information given to you following your procedure. If we do not reach you, we will leave a message.  We will attempt to reach you two times.  During this call, we will ask if you have developed any symptoms of COVID 19. If you develop any symptoms (ie: fever, flu-like symptoms, shortness of breath, cough etc.) before then, please call [phone].  If you test positive for Covid 19 in the 2 weeks post procedure, please call and report this information to [place].    If any biopsies were taken you will be contacted by phone or by letter within the next 1-3 weeks.  Please call us at [phone] if you have not heard about the biopsies in 3 weeks.    SIGNATURES/CONFIDENTIALITY: You and/or your care partner have signed paperwork which will be entered into your electronic medical record.  These signatures attest to the fact that that the information above on your After Visit Summary has been reviewed and is understood.  Full responsibility of the confidentiality of this discharge information lies with you and/or your care-partner.

## 2021-02-06 ENCOUNTER — Telehealth: Payer: Self-pay | Admitting: *Deleted

## 2021-02-06 NOTE — Telephone Encounter (Signed)
°  Follow up Call-  Call back number 02/02/2021  Post procedure Call Back phone  # [phone]  Permission to leave phone message Yes  Some recent data might be hidden     Patient questions:  Do you have a fever, pain , or abdominal swelling? No. Pain Score  0 *  Have you tolerated food without any problems? Yes.    Have you been able to return to your normal activities? Yes.    Do you have any questions about your discharge instructions: Diet   No. Medications  No. Follow up visit  No.  Do you have questions or concerns about your Care? No.  Actions: * If pain score is 4 or above: No action needed, pain <4.

## 2021-02-09 ENCOUNTER — Encounter: Payer: Self-pay | Admitting: Gastroenterology
